# Patient Record
Sex: Male | Born: 1995 | Race: Black or African American | Hispanic: No | Marital: Single | Smoking: Never smoker
Health system: Southern US, Community
[De-identification: ages and names within clinical notes are randomized; demographics above are authoritative.]

---

## 2018-06-20 DIAGNOSIS — R0789 Other chest pain: Secondary | ICD-10-CM | POA: Diagnosis not present

## 2018-06-20 DIAGNOSIS — M549 Dorsalgia, unspecified: Secondary | ICD-10-CM | POA: Diagnosis not present

## 2018-06-21 ENCOUNTER — Emergency Department (HOSPITAL_COMMUNITY): Payer: BLUE CROSS/BLUE SHIELD

## 2018-06-21 ENCOUNTER — Encounter (HOSPITAL_COMMUNITY): Payer: Self-pay | Admitting: Emergency Medicine

## 2018-06-21 ENCOUNTER — Other Ambulatory Visit: Payer: Self-pay

## 2018-06-21 ENCOUNTER — Emergency Department (HOSPITAL_COMMUNITY)
Admission: EM | Admit: 2018-06-21 | Discharge: 2018-06-21 | Disposition: A | Discharge status: Home / Self Care | Payer: BLUE CROSS/BLUE SHIELD | Attending: Emergency Medicine | Admitting: Emergency Medicine

## 2018-06-21 DIAGNOSIS — R0789 Other chest pain: Secondary | ICD-10-CM

## 2018-06-21 MED ORDER — IBUPROFEN 800 MG PO TABS
800.0000 mg | ORAL_TABLET | Freq: Once | ORAL | Status: AC
  Administered 2018-06-21: 800 mg via ORAL
  Filled 2018-06-21: qty 1

## 2018-06-21 NOTE — ED Triage Notes (Signed)
Pt reports back and chest soreness that has been ongoing for the last several days. Pt denies any injury or coughing.

## 2018-06-21 NOTE — Discharge Instructions (Addendum)
You may alternate Tylenol 1000 mg every 6 hours as needed for pain and Ibuprofen 800 mg every 8 hours as needed for pain.  Please take Ibuprofen with food. ° °Steps to find a Primary Care Provider (PCP): ° °Call [phone] or [phone] to access "[facility] Find a Doctor Service." ° °2.  You may also go on the [facility] website at [email or web address] ° °3.  [facility] also frequently accepts new patients. ° °[facility]  °[address] °[place] [address] °[phone] ° °4.  There are also multiple [facility], [facility] and [facility] practices throughout the [place] that are frequently accepting new patients. You may find a clinic that is close to your home and contact them. ° °[facility] °[email or web address] °[phone] ° °[facility] °[email or web address] ° °[facility] °[email or web address] °[phone] ° °[facility] °[email or web address] °[phone] ° °5.  Local Health Departments also can provide primary care services. ° °[facility]  °[address] °[place] [address] [phone] ° °[facility] °[address] °[place] [address] °[phone] ° °[facility] °[address]  °[place] [address] °[phone] ° ° °

## 2018-06-21 NOTE — ED Notes (Signed)
Patient transported to X-ray 

## 2018-06-21 NOTE — ED Provider Notes (Signed)
TIME SEEN: 2:32 AM  CHIEF COMPLAINT: Chest pain, back pain  HPI: Patient is a 22 year old male with no significant past medical history who presents to the emergency department with left-sided dull chest pain and left-sided upper back soreness that started 4 days ago.  No injury that he can recall.  Pain is worse when he lies on his left side.  No associated shortness of breath, fevers, cough, vomiting, diarrhea.  No family history of premature CAD.  No history of PE, DVT, exogenous estrogen use, recent fractures, surgery, trauma, hospitalization or prolonged travel. No lower extremity swelling or pain. No calf tenderness.  He has not a smoker.  ROS: See HPI Constitutional: no fever  Eyes: no drainage  ENT: no runny nose   Cardiovascular:  chest pain  Resp: no SOB  GI: no vomiting GU: no dysuria Integumentary: no rash  Allergy: no hives  Musculoskeletal: no leg swelling  Neurological: no slurred speech ROS otherwise negative  PAST MEDICAL HISTORY/PAST SURGICAL HISTORY:  History reviewed. No pertinent past medical history.  MEDICATIONS:  Prior to Admission medications   Not on File    ALLERGIES:  No Known Allergies  SOCIAL HISTORY:  Social History   Tobacco Use  . Smoking status: Never Smoker  . Smokeless tobacco: Never Used  Substance Use Topics  . Alcohol use: Never    Frequency: Never    FAMILY HISTORY: History reviewed. No pertinent family history.  EXAM: BP 123/83 (BP Location: Left Arm)   Pulse 71   Temp 98.4 F (36.9 C) (Oral)   Resp 18   Ht 5\' 11"  (1.803 m)   Wt 72.6 kg   SpO2 100%   BMI 22.32 kg/m  CONSTITUTIONAL: Alert and oriented and responds appropriately to questions. Well-appearing; well-nourished HEAD: Normocephalic EYES: Conjunctivae clear, pupils appear equal, EOMI ENT: normal nose; moist mucous membranes NECK: Supple, no meningismus, no nuchal rigidity, no LAD  CARD: RRR; S1 and S2 appreciated; no murmurs, no clicks, no rubs, no  gallops CHEST:  Chest wall is nontender to palpation.  No crepitus, ecchymosis, erythema, warmth, rash or other lesions present.   RESP: Normal chest excursion without splinting or tachypnea; breath sounds clear and equal bilaterally; no wheezes, no rhonchi, no rales, no hypoxia or respiratory distress, speaking full sentences ABD/GI: Normal bowel sounds; non-distended; soft, non-tender, no rebound, no guarding, no peritoneal signs, no hepatosplenomegaly BACK:  The back appears normal and is tender to palpation over the left thoracic paraspinal muscles without midline spinal tenderness or step-off or deformity, there is no CVA tenderness EXT: Normal ROM in all joints; non-tender to palpation; no edema; normal capillary refill; no cyanosis, no calf tenderness or swelling    SKIN: Normal color for age and race; warm; no rash NEURO: Moves all extremities equally PSYCH: The patient's mood and manner are appropriate. Grooming and personal hygiene are appropriate.  MEDICAL DECISION MAKING: Patient here with atypical chest pain.  Doubt ACS, PE or dissection.  He is PERC negative.  EKG shows no ischemic abnormality, arrhythmia or interval change.  Chest x-ray is clear and shows no pneumonia, pulmonary edema or pneumothorax.  I suspect that this is musculoskeletal in nature and have recommended alternating Tylenol and Motrin for pain.  Will give outpatient follow-up.  At this time, I do not feel there is any life-threatening condition present. I have reviewed and discussed all results (EKG, imaging, lab, urine as appropriate) and exam findings with patient/family. I have reviewed nursing notes and appropriate previous records.  I feel the patient is safe to be discharged home without further emergent workup and can continue workup as an outpatient as needed. Discussed usual and customary return precautions. Patient/family verbalize understanding and are comfortable with this plan.  Outpatient follow-up has been  provided as needed. All questions have been answered.     EKG Interpretation  Date/Time:  Wednesday June 21 2018 02:18:48 EST Ventricular Rate:  83 PR Interval:    QRS Duration: 87 QT Interval:  352 QTC Calculation: 414 R Axis:   93 Text Interpretation:  Sinus rhythm Borderline right axis deviation ST elevation, likely early repolarization No old tracing to compare Confirmed by Ward, Baxter HireKristen [phone]([id number]) on 06/21/2018 2:50:33 AM          Ward, Layla MawKristen N, DO 06/21/18 [id number]

## 2018-06-21 NOTE — ED Notes (Signed)
Bed: WTR5 Expected date:  Expected time:  Means of arrival:  Comments: 

## 2018-08-22 ENCOUNTER — Encounter (HOSPITAL_COMMUNITY): Payer: Self-pay

## 2018-08-22 ENCOUNTER — Emergency Department (HOSPITAL_COMMUNITY)
Admission: EM | Admit: 2018-08-22 | Discharge: 2018-08-23 | Disposition: A | Discharge status: Home / Self Care | Payer: BLUE CROSS/BLUE SHIELD | Attending: Emergency Medicine | Admitting: Emergency Medicine

## 2018-08-22 ENCOUNTER — Other Ambulatory Visit: Payer: Self-pay

## 2018-08-22 ENCOUNTER — Emergency Department (HOSPITAL_COMMUNITY): Payer: BLUE CROSS/BLUE SHIELD

## 2018-08-22 DIAGNOSIS — M546 Pain in thoracic spine: Secondary | ICD-10-CM | POA: Insufficient documentation

## 2018-08-22 DIAGNOSIS — R0789 Other chest pain: Secondary | ICD-10-CM | POA: Insufficient documentation

## 2018-08-22 DIAGNOSIS — R079 Chest pain, unspecified: Secondary | ICD-10-CM

## 2018-08-22 LAB — CBC
HCT: 50.5 % (ref 39.0–52.0)
Hemoglobin: 16.8 g/dL (ref 13.0–17.0)
MCH: 28.9 pg (ref 26.0–34.0)
MCHC: 33.3 g/dL (ref 30.0–36.0)
MCV: 86.8 fL (ref 80.0–100.0)
Platelets: 241 10*3/uL (ref 150–400)
RBC: 5.82 MIL/uL — ABNORMAL HIGH (ref 4.22–5.81)
RDW: 10.9 % — ABNORMAL LOW (ref 11.5–15.5)
WBC: 6.1 10*3/uL (ref 4.0–10.5)
nRBC: 0 % (ref 0.0–0.2)

## 2018-08-22 LAB — BASIC METABOLIC PANEL
Anion gap: 11 (ref 5–15)
BUN: 14 mg/dL (ref 6–20)
CHLORIDE: 102 mmol/L (ref 98–111)
CO2: 25 mmol/L (ref 22–32)
Calcium: 10.2 mg/dL (ref 8.9–10.3)
Creatinine, Ser: 1.1 mg/dL (ref 0.61–1.24)
GFR calc Af Amer: >60 mL/min (ref >60)
GFR calc non Af Amer: >60 mL/min (ref >60)
Glucose, Bld: 105 mg/dL — ABNORMAL HIGH (ref 70–99)
Potassium: 4.1 mmol/L (ref 3.5–5.1)
Sodium: 138 mmol/L (ref 135–145)

## 2018-08-22 LAB — I-STAT TROPONIN, ED: Troponin i, poc: 0 ng/mL (ref 0.00–0.08)

## 2018-08-22 MED ORDER — SODIUM CHLORIDE 0.9% FLUSH: 3.0000 mL | Freq: Once | INTRAVENOUS | Status: DC

## 2018-08-22 NOTE — ED Triage Notes (Signed)
Pt reports left sided chest pain that radiates to his back and left shoulder that woke him out of his sleep this morning. Pt a.o, nad noted

## 2018-08-23 MED ORDER — NAPROXEN 500 MG PO TABS: 500.0000 mg | ORAL_TABLET | Freq: Two times a day (BID) | ORAL | 0 refills | Status: AC

## 2018-08-23 NOTE — ED Provider Notes (Signed)
[facility] EMERGENCY DEPARTMENT Provider Note   CSN: [id number] Arrival date & time: 08/22/18  1800     History   Chief Complaint Chief Complaint  Patient presents with  . Chest Pain  . Back Pain    HPI George Rowland is a 23 y.o. male.  The history is provided by the patient and medical records.  Chest Pain  Associated symptoms: back pain   Back Pain  Associated symptoms: chest pain      23 year old male with no significant past medical history presenting to the ED for left-sided chest pain.  States he woke up with the pain this morning.  Pain begins in left lateral chest and radiates into left shoulder and left upper back.  He has no associated shortness of breath, diaphoresis, nausea, vomiting, lightheadedness, or feelings of syncope.  He has not noticed if pain is worse with any particular movement but states if he stretches he feels a little bit of a "pulling" sensation.  He denies any numbness or weakness of the left arm.  He has no prior cardiac history.  No family cardiac history.  He is not a smoker.  He thought maybe he was having some gas pain so he took some Mylanta today without change.  He has not tried any other medications.  History reviewed. No pertinent past medical history.  There are no active problems to display for this patient.   History reviewed. No pertinent surgical history.      Home Medications    Prior to Admission medications   Not on File    Family History No family history on file.  Social History Social History   Tobacco Use  . Smoking status: Never Smoker  . Smokeless tobacco: Never Used  Substance Use Topics  . Alcohol use: Never    Frequency: Never  . Drug use: Never     Allergies   Patient has no known allergies.   Review of Systems Review of Systems  Cardiovascular: Positive for chest pain.  Musculoskeletal: Positive for back pain.  All other systems reviewed and are negative.    Physical  Exam Updated Vital Signs BP 133/75 (BP Location: Right Arm)   Pulse 85   Temp 98.5 F (36.9 C) (Oral)   Resp 18   SpO2 100%   Physical Exam Vitals signs and nursing note reviewed.  Constitutional:      Appearance: He is well-developed.  HENT:     Head: Normocephalic and atraumatic.  Eyes:     Conjunctiva/sclera: Conjunctivae normal.     Pupils: Pupils are equal, round, and reactive to light.  Neck:     Musculoskeletal: Normal range of motion.  Cardiovascular:     Rate and Rhythm: Normal rate and regular rhythm.     Heart sounds: Normal heart sounds.  Pulmonary:     Effort: Pulmonary effort is normal.     Breath sounds: Normal breath sounds. No decreased breath sounds, wheezing or rhonchi.  Chest:     Comments: Chest wall is nontender, but pain is elicited with bringing left arm behind the back; no deformities noted Abdominal:     General: Bowel sounds are normal.     Palpations: Abdomen is soft.  Musculoskeletal: Normal range of motion.  Skin:    General: Skin is warm and dry.  Neurological:     Mental Status: He is alert and oriented to person, place, and time.      ED Treatments / Results  Labs (  all labs ordered are listed, but only abnormal results are displayed) Labs Reviewed  BASIC METABOLIC PANEL - Abnormal; Notable for the following components:      Result Value   Glucose, Bld 105 (*)    All other components within normal limits  CBC - Abnormal; Notable for the following components:   RBC 5.82 (*)    RDW 10.9 (*)    All other components within normal limits  I-STAT TROPONIN, ED    EKG EKG Interpretation  Date/Time:  Tuesday August 22 2018 18:10:56 EST Ventricular Rate:  81 PR Interval:  154 QRS Duration: 82 QT Interval:  338 QTC Calculation: 392 R Axis:   99 Text Interpretation:  Normal sinus rhythm Right atrial enlargement Rightward axis Pulmonary disease pattern Abnormal ECG When compared with ECG of 06/21/2018, No significant change was  found Confirmed by Dione Booze ([id number]) on 08/22/2018 11:35:58 PM   Radiology Dg Chest 2 View  Result Date: 08/22/2018 CLINICAL DATA:  Left-sided chest pain radiating to the back and shoulder. EXAM: CHEST - 2 VIEW COMPARISON:  06/21/2018 FINDINGS: Heart size is normal. Mediastinal shadows are normal. The lungs are clear. No bronchial thickening. No infiltrate, mass, effusion or collapse. Pulmonary vascularity is normal. No bony abnormality. External artifact overlies the right upper chest. IMPRESSION: Normal chest Electronically Signed   By: Paulina Fusi M.D.   On: 08/22/2018 18:51    Procedures Procedures (including critical care time)  Medications Ordered in ED Medications  sodium chloride flush (NS) 0.9 % injection 3 mL (has no administration in time range)     Initial Impression / Assessment and Plan / ED Course  I have reviewed the triage vital signs and the nursing notes.  Pertinent labs & imaging results that were available during my care of the patient were reviewed by me and considered in my medical decision making (see chart for details).  23 year old male here with chest pain that radiates into left shoulder and upper back, present this morning upon waking.  He is generally healthy, no known personal or family cardiac issues.  He is not a smoker.  EKG sinus rhythm without acute ischemic changes.  Labs reassuring.  Chest x-ray is clear.  His chest wall is nontender to palpation but pain is elicited when left arm is brought behind the back.  There is no deformity.  Suspect this is likely musculoskeletal.  Will start course of anti-inflammatories.  Can follow-up with PCP.  Return here for any new/acute changes.  Final Clinical Impressions(s) / ED Diagnoses   Final diagnoses:  Chest pain in adult    ED Discharge Orders         Ordered    naproxen (NAPROSYN) 500 MG tablet  2 times daily with meals     08/23/18 0114           Garlon Hatchet, PA-C 08/23/18 0117      Dione Booze, MD 08/23/18 [id number]

## 2018-08-23 NOTE — Discharge Instructions (Signed)
EKG, labs, and chest x-ray today were all normal. Take the prescribed medication as directed. Follow-up with your primary care doctor. Return to the ED for new or worsening symptoms.

## 2019-12-24 IMAGING — DX DG CHEST 2V
2 series · 2 of 2 positions shown · non-contrast
Comparison: 06/21/2018

CLINICAL DATA: Left-sided chest pain radiating to the back and
shoulder.

EXAM:
CHEST - 2 VIEW

[chest pa]
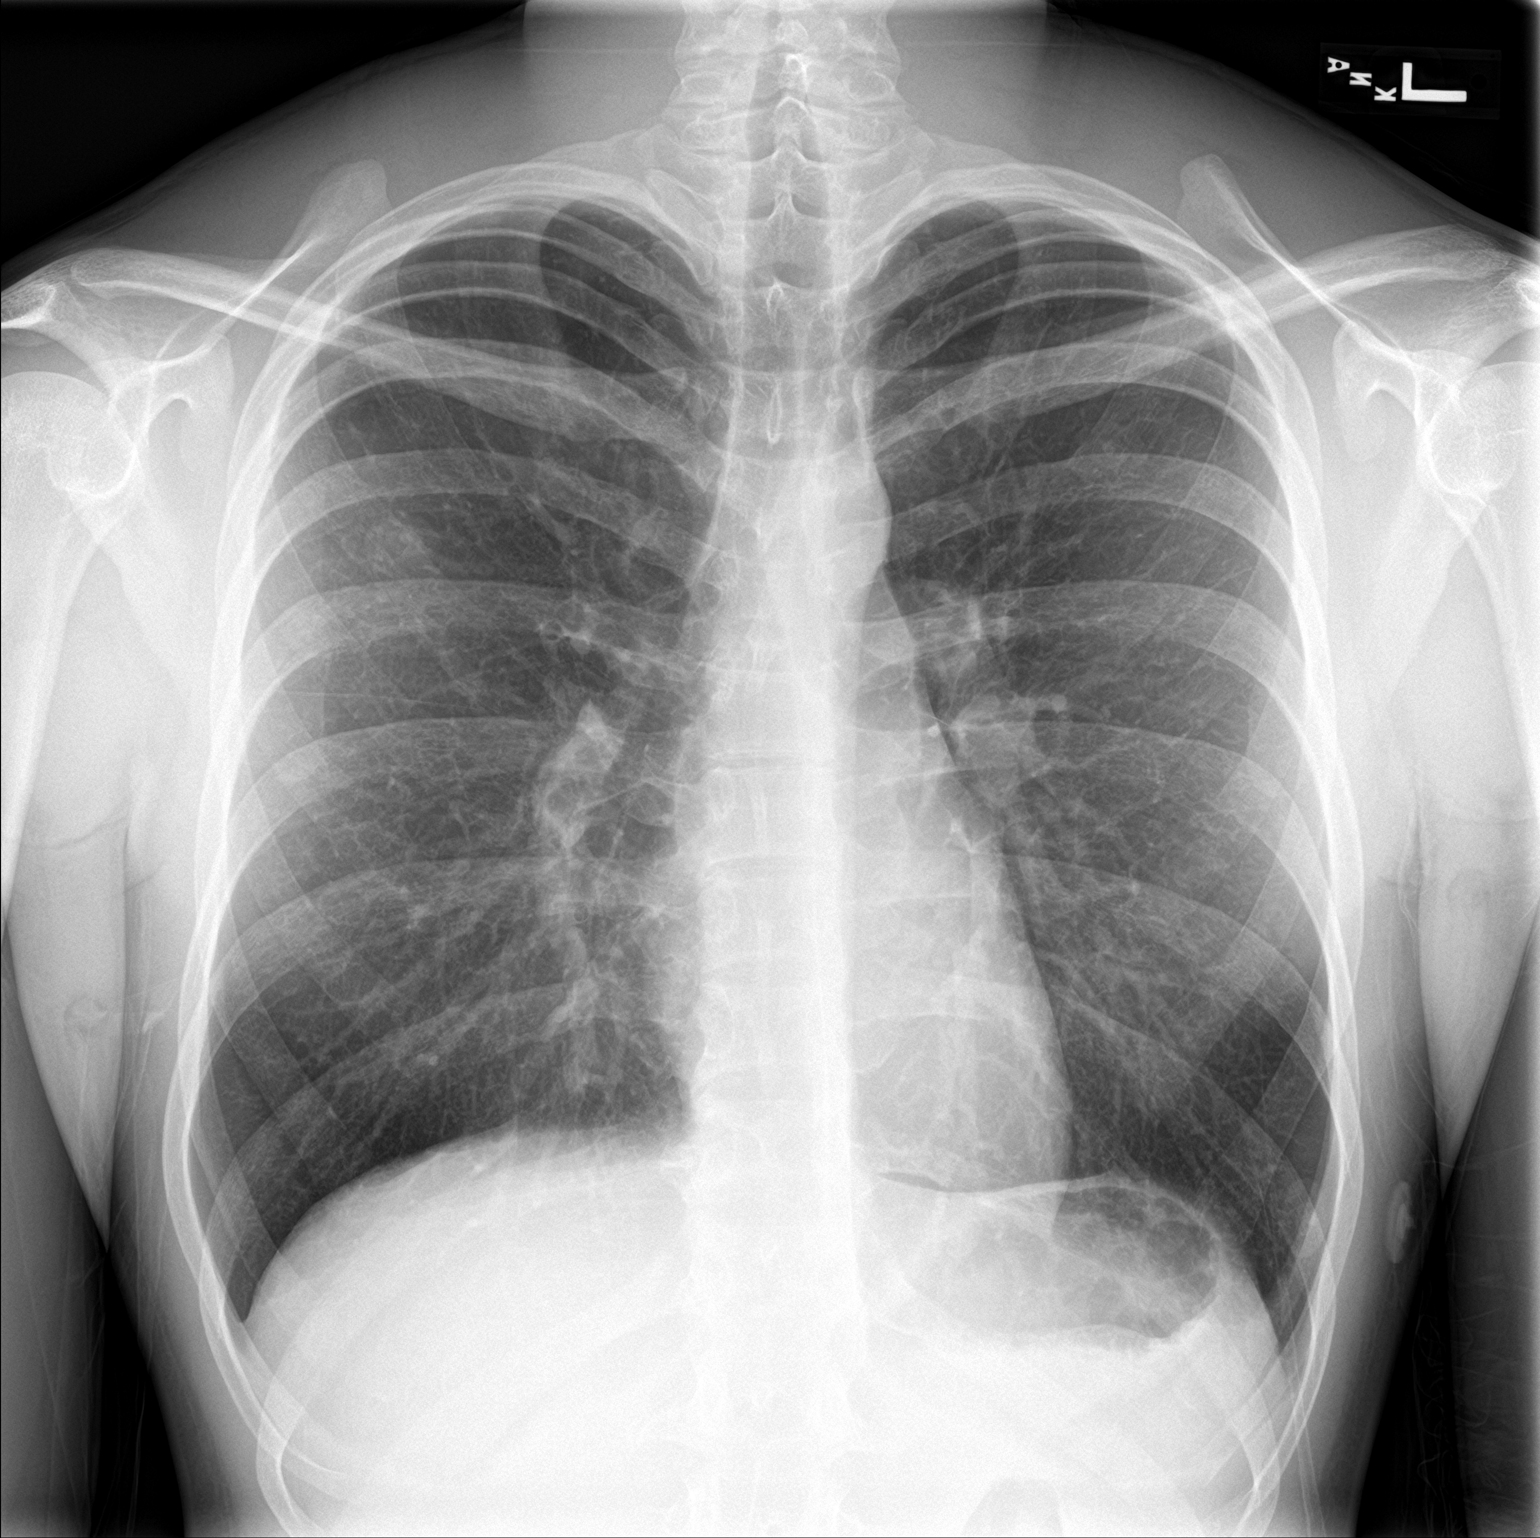

[chest lat]
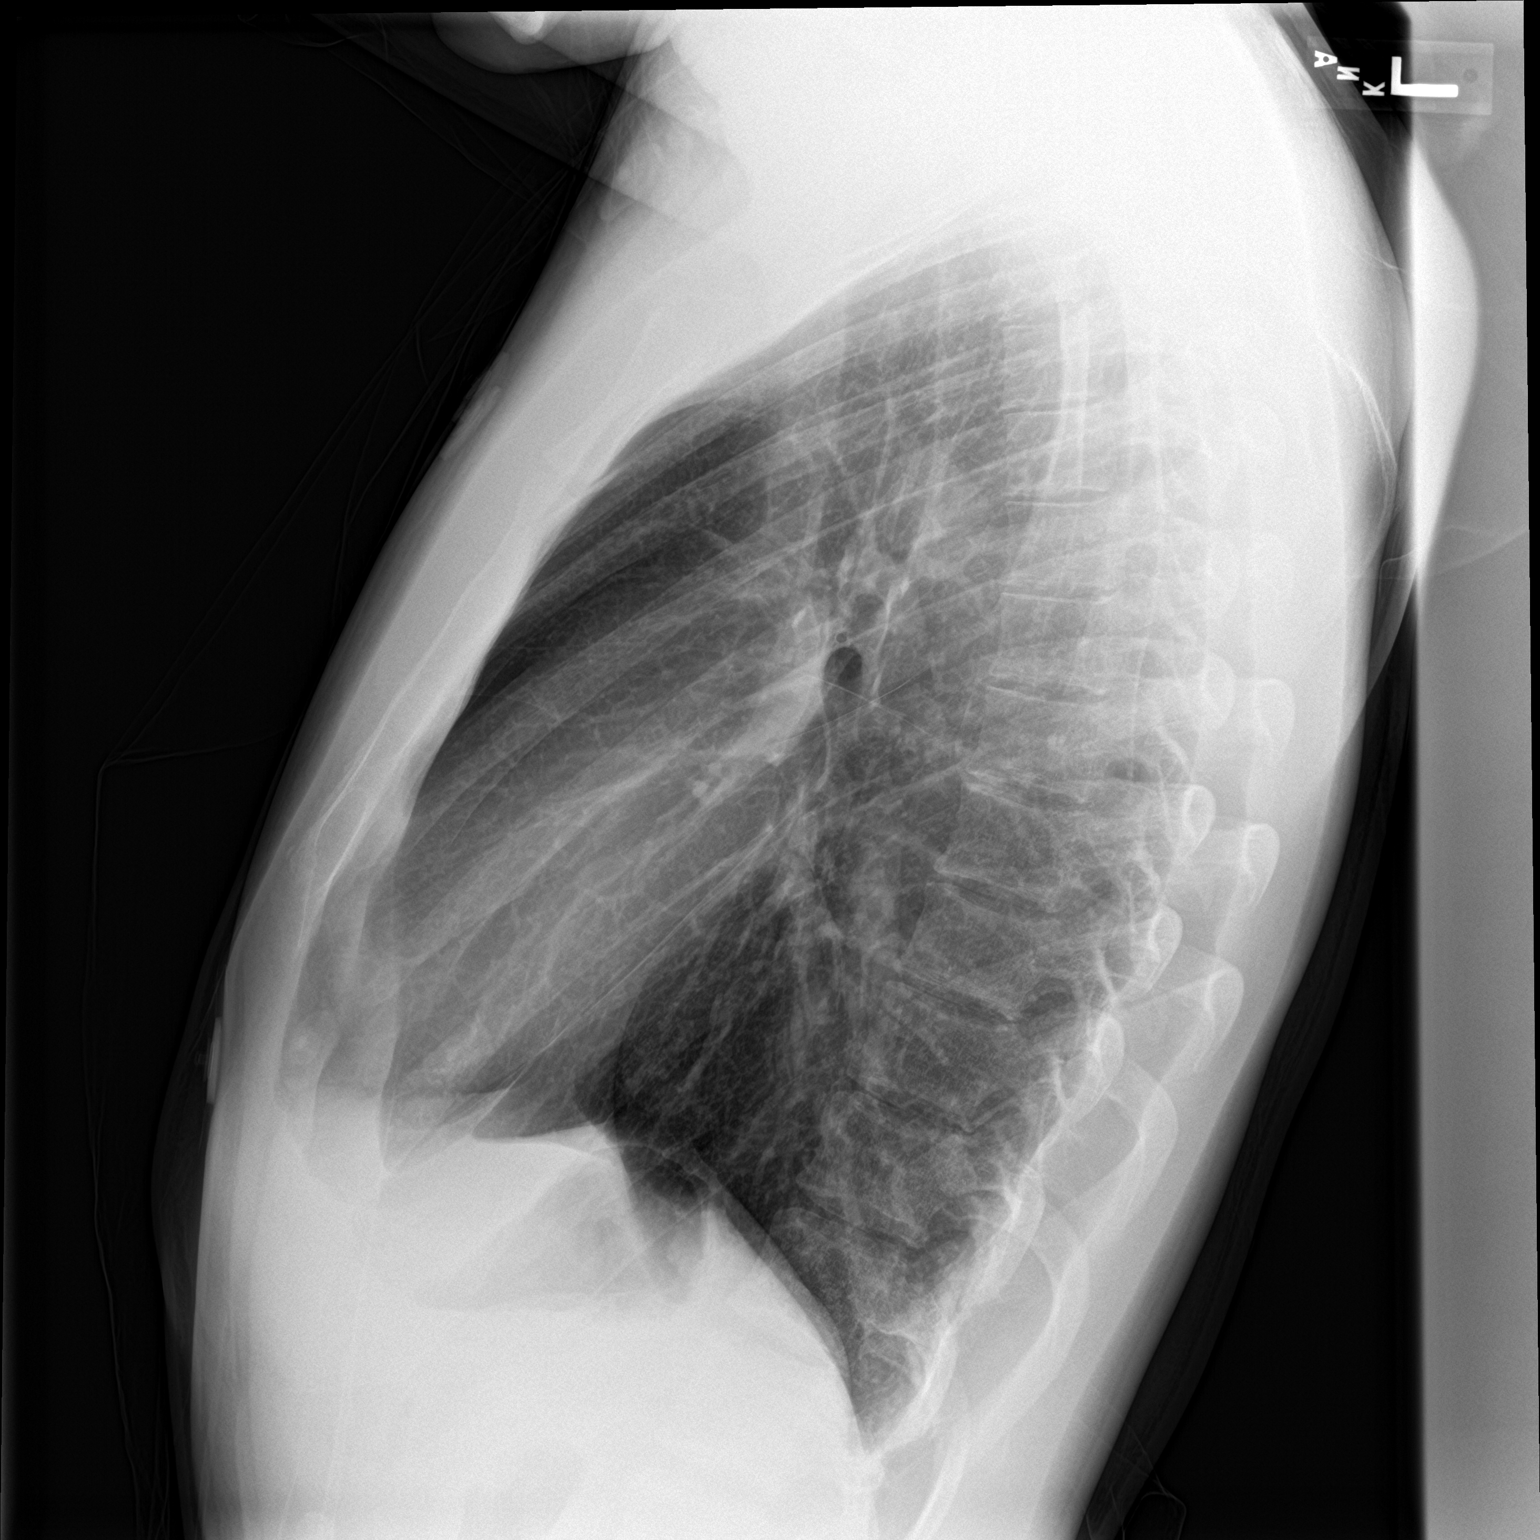

[2 of 2 positions shown; findings below may reference images not displayed]

FINDINGS: Heart size is normal. Mediastinal shadows are normal. The lungs are
clear. No bronchial thickening. No infiltrate, mass, effusion or
collapse. Pulmonary vascularity is normal. No bony abnormality.
External artifact overlies the right upper chest.
IMPRESSION: Normal chest
